# Patient Record
Sex: Male | Born: 1960 | Race: White | Hispanic: No | Smoking: Current every day smoker
Health system: Southern US, Community
[De-identification: ages and names within clinical notes are randomized; demographics above are authoritative.]

## PROBLEM LIST (undated history)

## (undated) DIAGNOSIS — I10 Essential (primary) hypertension: Secondary | ICD-10-CM

## (undated) DIAGNOSIS — M549 Dorsalgia, unspecified: Secondary | ICD-10-CM

## (undated) DIAGNOSIS — E119 Type 2 diabetes mellitus without complications: Secondary | ICD-10-CM

---

## 1998-06-12 ENCOUNTER — Other Ambulatory Visit: Admission: RE | Admit: 1998-06-12 | Discharge: 1998-06-12 | Payer: Self-pay | Admitting: Family Medicine

## 1999-02-27 ENCOUNTER — Emergency Department (HOSPITAL_COMMUNITY): Admission: EM | Admit: 1999-02-27 | Discharge: 1999-02-27 | Payer: Self-pay | Admitting: *Deleted

## 1999-02-27 ENCOUNTER — Encounter: Payer: Self-pay | Admitting: Emergency Medicine

## 1999-03-27 ENCOUNTER — Encounter: Payer: Self-pay | Admitting: Emergency Medicine

## 1999-03-27 ENCOUNTER — Emergency Department (HOSPITAL_COMMUNITY): Admission: EM | Admit: 1999-03-27 | Discharge: 1999-03-27 | Payer: Self-pay | Admitting: Emergency Medicine

## 1999-04-01 ENCOUNTER — Emergency Department (HOSPITAL_COMMUNITY): Admission: EM | Admit: 1999-04-01 | Discharge: 1999-04-01 | Payer: Self-pay

## 1999-04-01 ENCOUNTER — Encounter: Payer: Self-pay | Admitting: Emergency Medicine

## 1999-06-02 ENCOUNTER — Encounter: Payer: Self-pay | Admitting: Emergency Medicine

## 1999-06-02 ENCOUNTER — Inpatient Hospital Stay (HOSPITAL_COMMUNITY): Admission: EM | Admit: 1999-06-02 | Discharge: 1999-06-07 | Payer: Self-pay | Admitting: Emergency Medicine

## 1999-06-04 ENCOUNTER — Encounter: Payer: Self-pay | Admitting: Family Medicine

## 1999-06-05 ENCOUNTER — Encounter: Payer: Self-pay | Admitting: Family Medicine

## 1999-06-14 ENCOUNTER — Encounter: Admission: RE | Admit: 1999-06-14 | Discharge: 1999-06-14 | Payer: Self-pay | Admitting: Sports Medicine

## 2000-02-04 ENCOUNTER — Emergency Department (HOSPITAL_COMMUNITY): Admission: EM | Admit: 2000-02-04 | Discharge: 2000-02-04 | Payer: Self-pay | Admitting: Emergency Medicine

## 2005-01-22 ENCOUNTER — Emergency Department (HOSPITAL_COMMUNITY): Admission: EM | Admit: 2005-01-22 | Discharge: 2005-01-22 | Payer: Self-pay | Admitting: Emergency Medicine

## 2005-07-26 ENCOUNTER — Emergency Department (HOSPITAL_COMMUNITY): Admission: EM | Admit: 2005-07-26 | Discharge: 2005-07-26 | Payer: Self-pay | Admitting: Emergency Medicine

## 2006-05-08 ENCOUNTER — Emergency Department (HOSPITAL_COMMUNITY): Admission: EM | Admit: 2006-05-08 | Discharge: 2006-05-08 | Payer: Self-pay | Admitting: *Deleted

## 2006-12-28 ENCOUNTER — Emergency Department (HOSPITAL_COMMUNITY): Admission: EM | Admit: 2006-12-28 | Discharge: 2006-12-28 | Payer: Self-pay | Admitting: Emergency Medicine

## 2007-04-05 ENCOUNTER — Emergency Department (HOSPITAL_COMMUNITY): Admission: EM | Admit: 2007-04-05 | Discharge: 2007-04-06 | Payer: Self-pay | Admitting: Emergency Medicine

## 2007-05-06 ENCOUNTER — Emergency Department (HOSPITAL_COMMUNITY): Admission: EM | Admit: 2007-05-06 | Discharge: 2007-05-06 | Payer: Self-pay | Admitting: Emergency Medicine

## 2007-05-26 ENCOUNTER — Emergency Department (HOSPITAL_COMMUNITY): Admission: EM | Admit: 2007-05-26 | Discharge: 2007-05-27 | Payer: Self-pay | Admitting: Emergency Medicine

## 2007-06-06 ENCOUNTER — Emergency Department (HOSPITAL_COMMUNITY): Admission: EM | Admit: 2007-06-06 | Discharge: 2007-06-07 | Payer: Self-pay | Admitting: Emergency Medicine

## 2007-06-08 ENCOUNTER — Ambulatory Visit (HOSPITAL_COMMUNITY): Admission: RE | Admit: 2007-06-08 | Discharge: 2007-06-08 | Payer: Self-pay | Admitting: Sports Medicine

## 2007-06-28 ENCOUNTER — Emergency Department (HOSPITAL_COMMUNITY): Admission: EM | Admit: 2007-06-28 | Discharge: 2007-06-28 | Payer: Self-pay | Admitting: Emergency Medicine

## 2007-07-07 ENCOUNTER — Emergency Department (HOSPITAL_COMMUNITY): Admission: EM | Admit: 2007-07-07 | Discharge: 2007-07-07 | Payer: Self-pay | Admitting: Emergency Medicine

## 2007-07-24 ENCOUNTER — Emergency Department (HOSPITAL_COMMUNITY): Admission: EM | Admit: 2007-07-24 | Discharge: 2007-07-25 | Payer: Self-pay | Admitting: Emergency Medicine

## 2007-07-31 ENCOUNTER — Emergency Department (HOSPITAL_COMMUNITY): Admission: EM | Admit: 2007-07-31 | Discharge: 2007-08-01 | Payer: Self-pay | Admitting: Emergency Medicine

## 2007-08-17 ENCOUNTER — Encounter: Admission: RE | Admit: 2007-08-17 | Discharge: 2007-08-17 | Payer: Self-pay | Admitting: Sports Medicine

## 2007-08-25 ENCOUNTER — Emergency Department (HOSPITAL_COMMUNITY): Admission: EM | Admit: 2007-08-25 | Discharge: 2007-08-25 | Payer: Self-pay | Admitting: Emergency Medicine

## 2007-08-27 ENCOUNTER — Emergency Department (HOSPITAL_COMMUNITY): Admission: EM | Admit: 2007-08-27 | Discharge: 2007-08-27 | Payer: Self-pay | Admitting: Emergency Medicine

## 2007-09-15 ENCOUNTER — Emergency Department (HOSPITAL_COMMUNITY): Admission: EM | Admit: 2007-09-15 | Discharge: 2007-09-15 | Payer: Self-pay | Admitting: Emergency Medicine

## 2007-09-26 ENCOUNTER — Emergency Department (HOSPITAL_COMMUNITY): Admission: EM | Admit: 2007-09-26 | Discharge: 2007-09-26 | Payer: Self-pay | Admitting: Emergency Medicine

## 2007-11-03 ENCOUNTER — Emergency Department: Payer: Self-pay | Admitting: Emergency Medicine

## 2007-11-28 ENCOUNTER — Emergency Department (HOSPITAL_COMMUNITY): Admission: EM | Admit: 2007-11-28 | Discharge: 2007-11-28 | Payer: Self-pay | Admitting: Emergency Medicine

## 2007-12-16 ENCOUNTER — Emergency Department (HOSPITAL_COMMUNITY): Admission: EM | Admit: 2007-12-16 | Discharge: 2007-12-16 | Payer: Self-pay | Admitting: Emergency Medicine

## 2007-12-30 ENCOUNTER — Emergency Department: Payer: Self-pay | Admitting: Emergency Medicine

## 2007-12-30 ENCOUNTER — Emergency Department (HOSPITAL_COMMUNITY): Admission: EM | Admit: 2007-12-30 | Discharge: 2007-12-30 | Payer: Self-pay | Admitting: Emergency Medicine

## 2008-07-27 ENCOUNTER — Emergency Department (HOSPITAL_COMMUNITY): Admission: EM | Admit: 2008-07-27 | Discharge: 2008-07-27 | Payer: Self-pay | Admitting: Emergency Medicine

## 2008-09-19 IMAGING — CR DG ORBITS FOR FOREIGN BODY
2 series · 2 of 2 positions shown · non-contrast
Comparison: none

CLINICAL DATA: 45 year-old, pre-MRI lumbar spine.
 ORBITS ? 2 VIEW:

[view not recorded (1 of 2)]
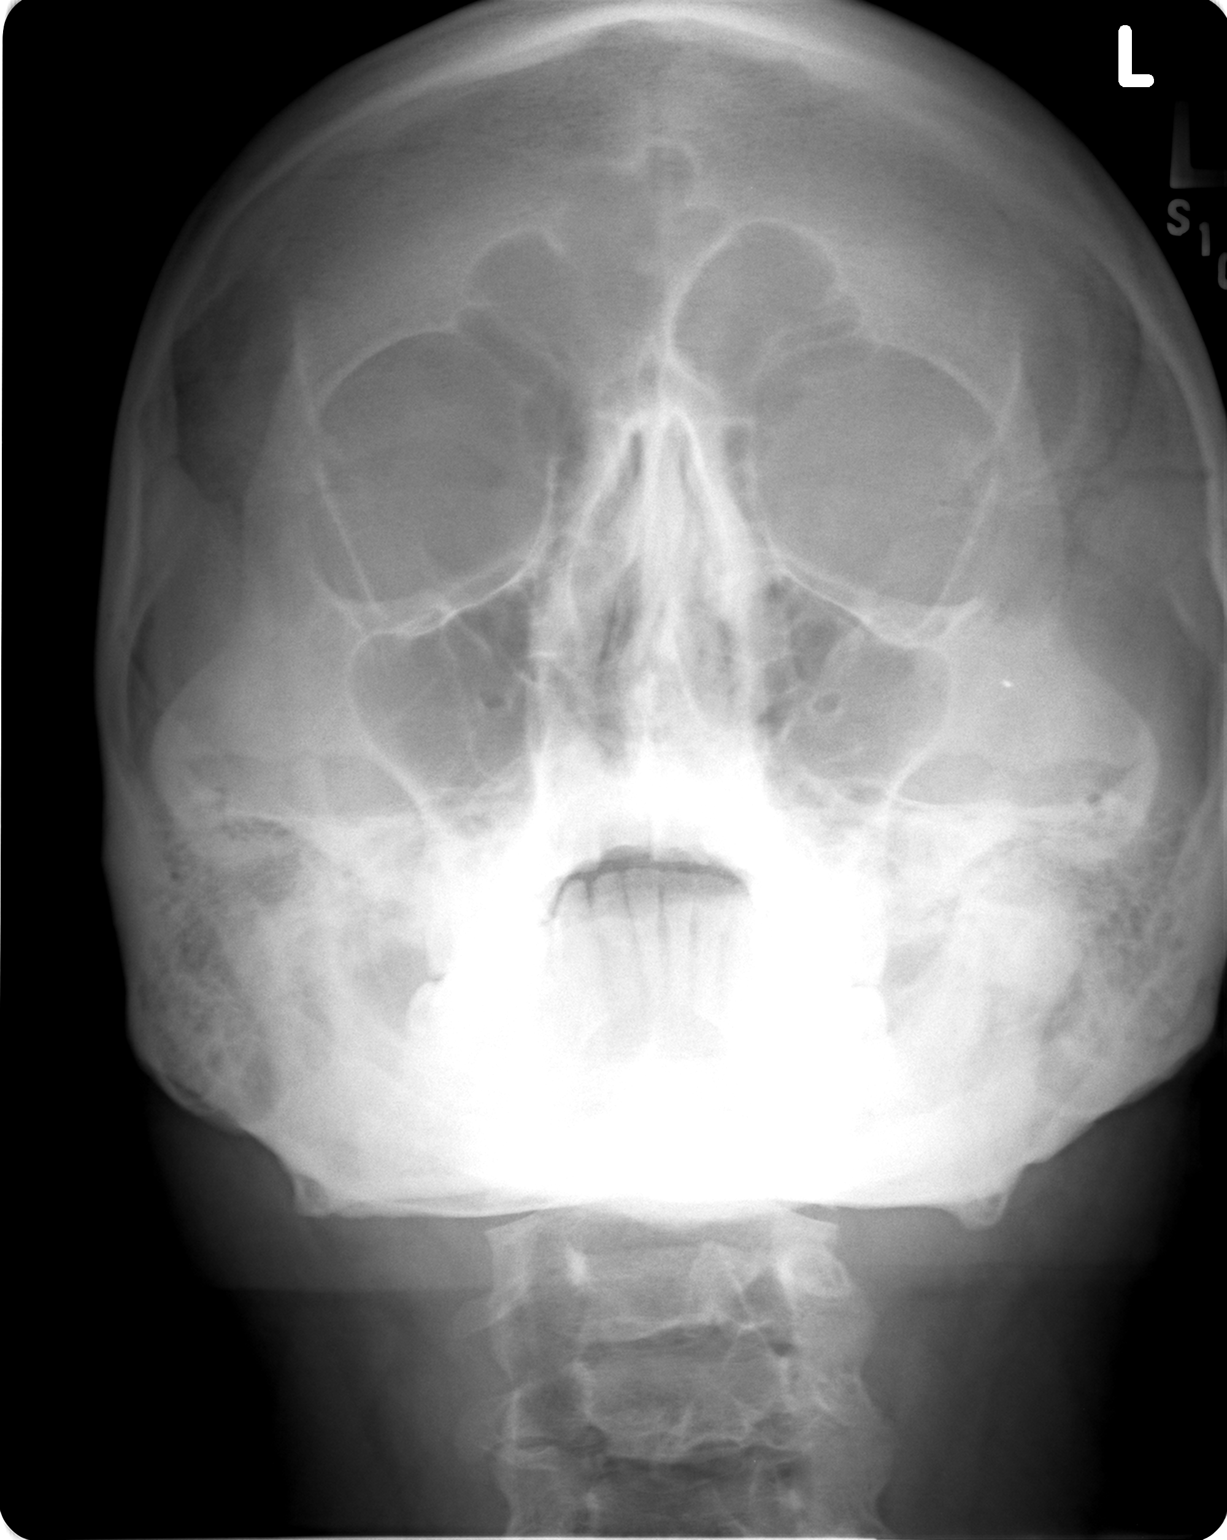

[view not recorded (2 of 2)]
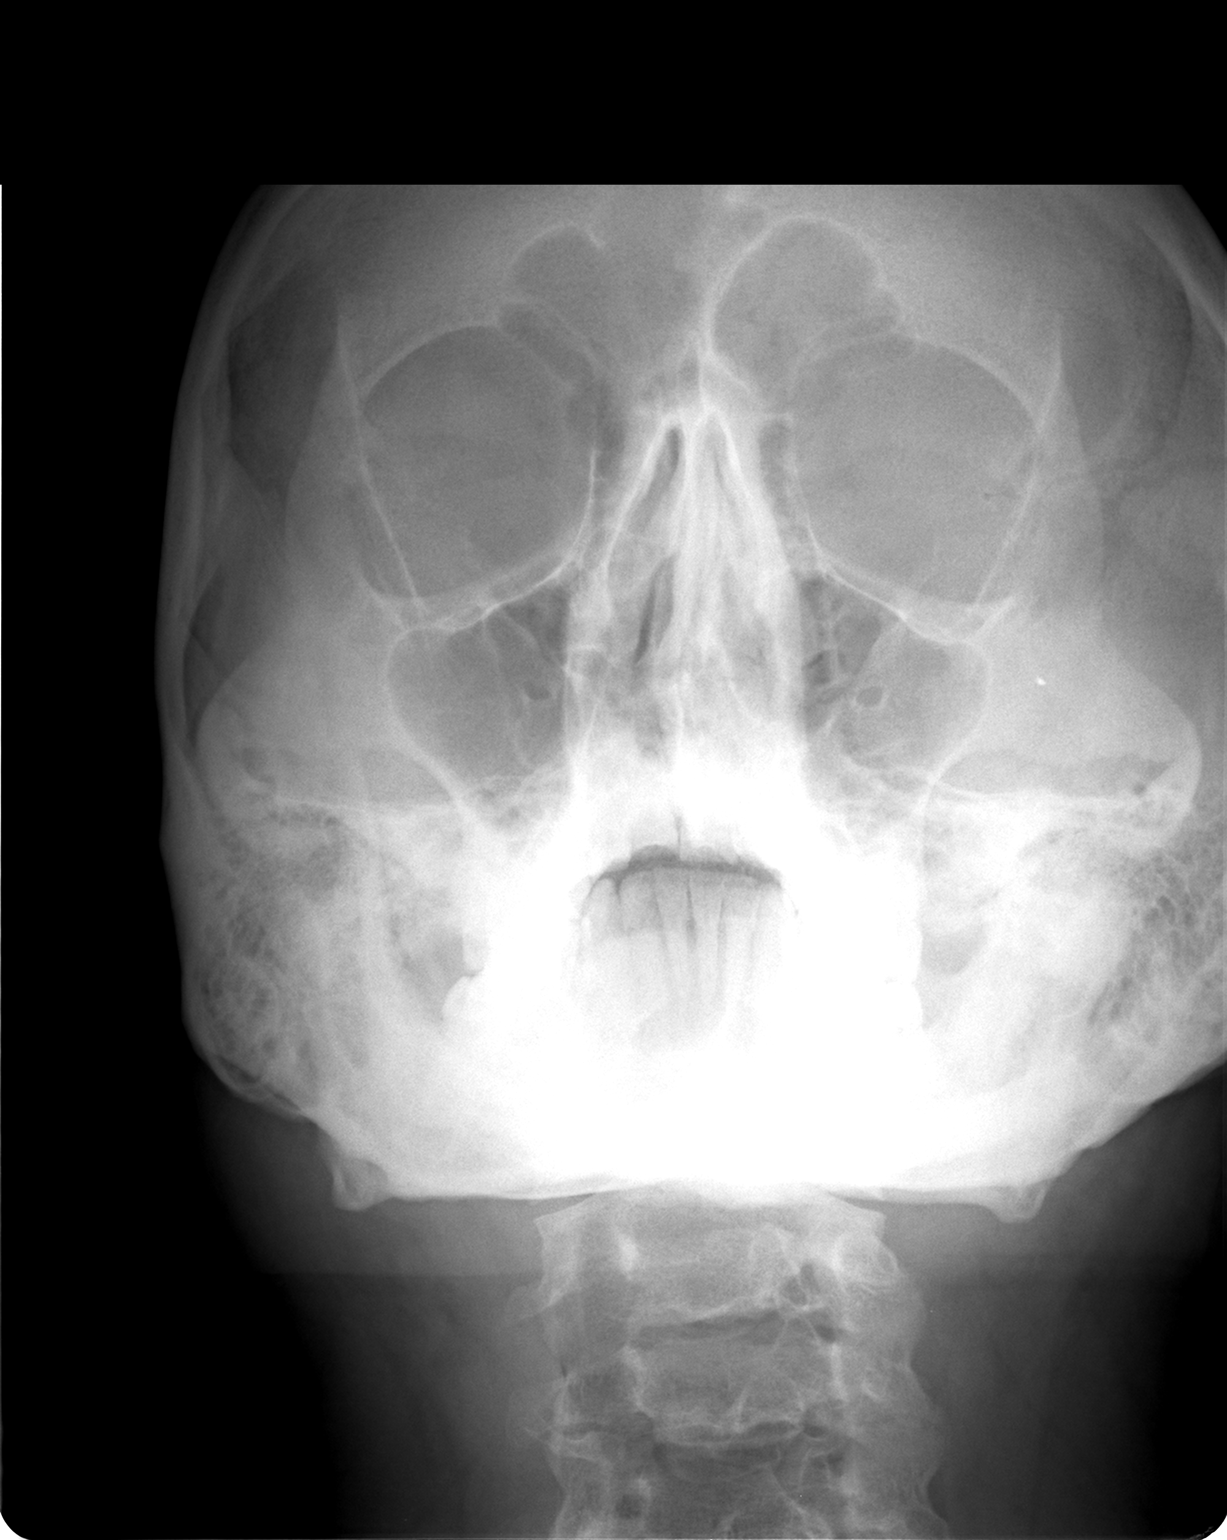

[2 of 2 positions shown; findings below may reference images not displayed]

FINDINGS: There is a tiny metallic foreign body overlying the left maxillary area.  No orbital metallic foreign bodies are seen.
IMPRESSION: No metallic foreign bodies over the orbits.  There is a small foreign body overlying the left maxilla.

## 2008-11-28 IMAGING — CR DG MYELOGRAM LUMBAR
4 series · 4 of 4 positions shown · IV contrast (omnipaque)
Comparison: Lumbar spine films 05/06/07.

CLINICAL DATA: Low back pain.  
 LUMBAR MYELOGRAM:
 Following informed consent, sterile preparation of the back, and adequate local anesthesia, a lumbar puncture was performed using a 22 gauge 5 inch needle at L3-4 from a right paramedian approach.  Fluid was clear and colorless.  15cc of Omnipaque 180 was instilled in the subarachnoid space.  AP, lateral, and oblique views demonstrate
TECHNIQUE: Multidetector CT imaging of the lumbar spine was performed after intrathecal injection of contrast.  Multiplanar CT image reconstructions were also generated.

[view not recorded (1 of 4)]
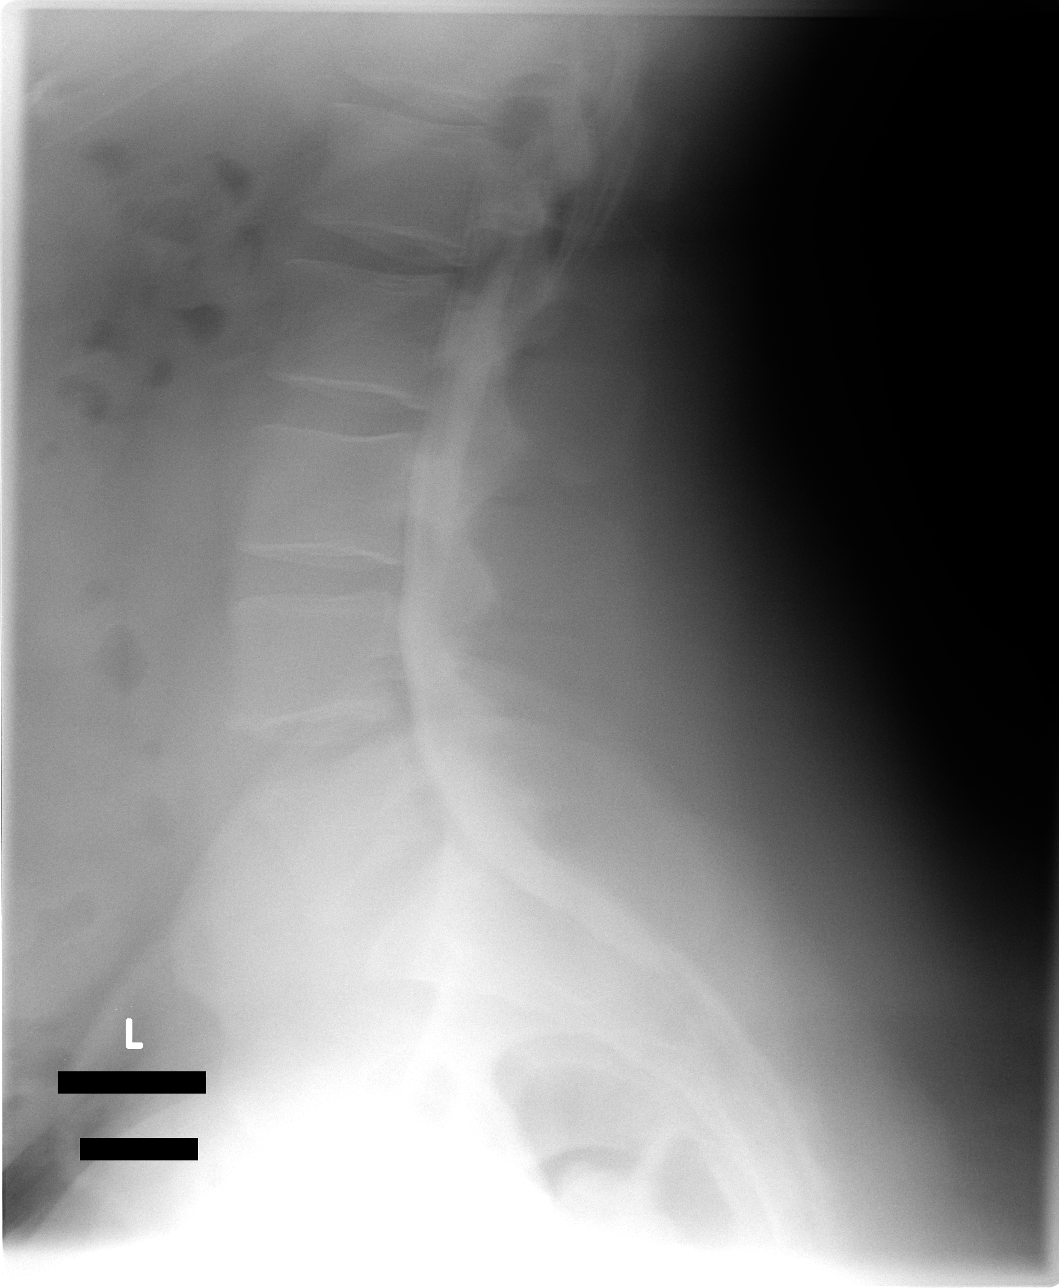

[view not recorded (2 of 4)]
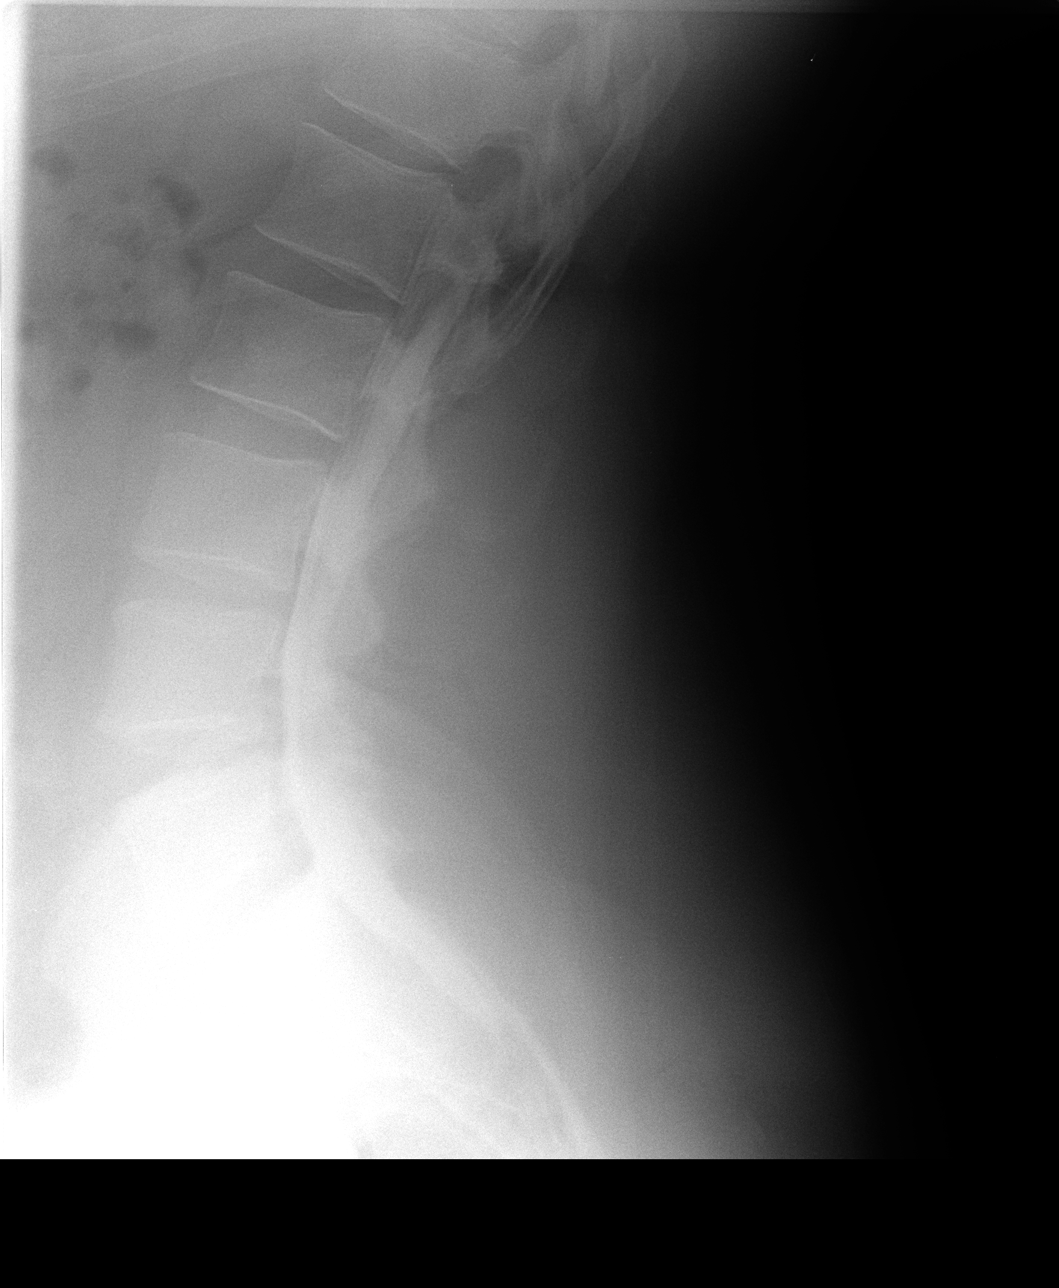

[view not recorded (3 of 4)]
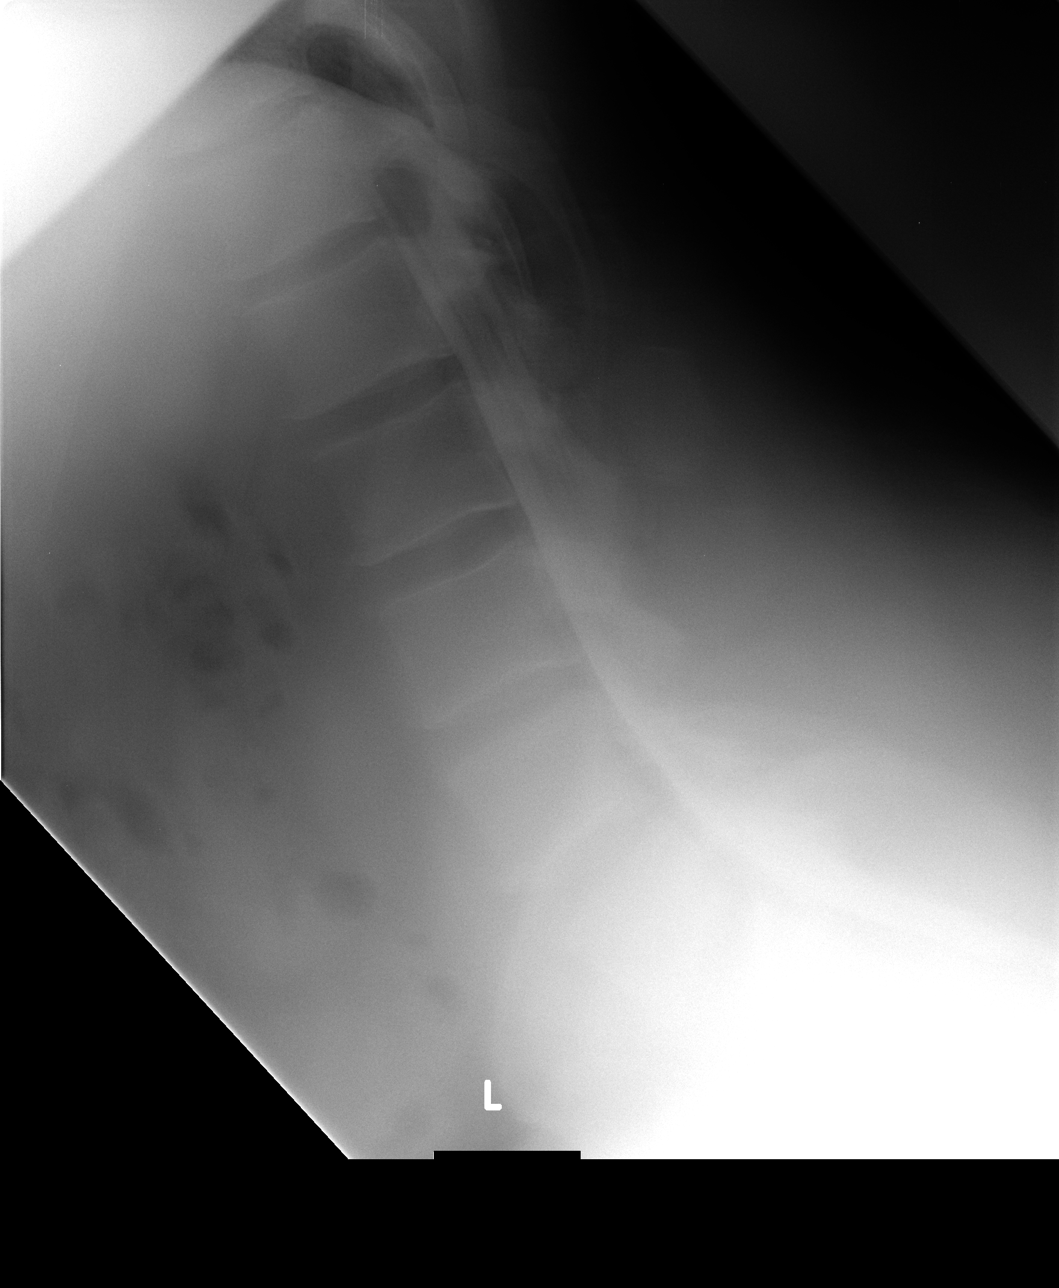

[view not recorded (4 of 4)]
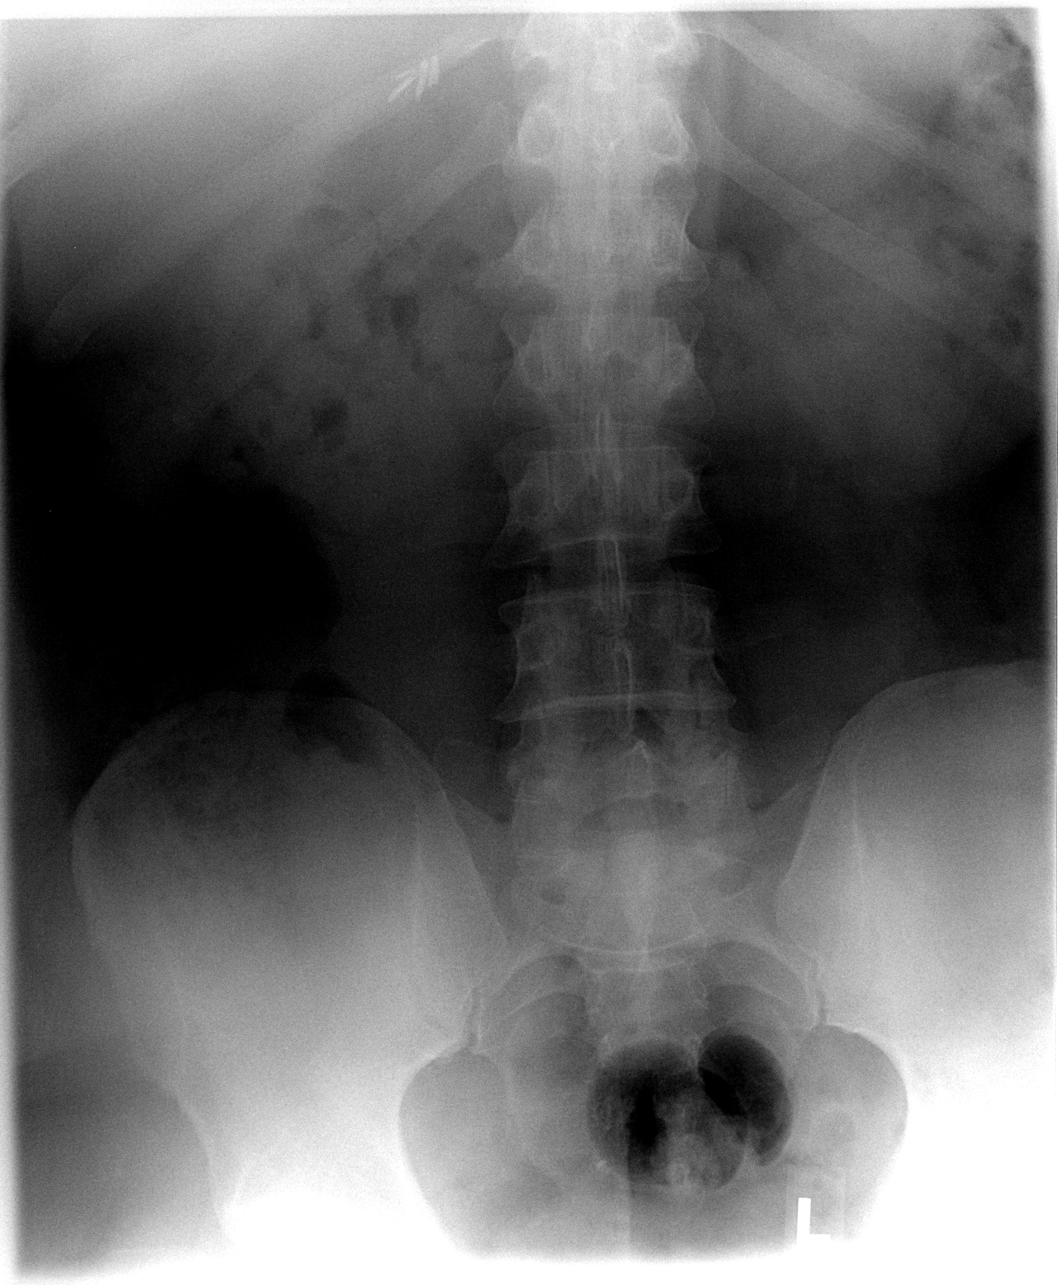

[4 of 4 positions shown; findings below may reference images not displayed]

FINDINGS: AP, lateral, and oblique views demonstrate no significant nerve root cut off or spinal stenosis.  Flexion/extension shows no abnormal movement.
IMPRESSION: Unremarkable lumbar myelography. 
 CT LUMBAR SPINE W/CONTRAST (POST-MYELOGRAM):
FINDINGS: Image quality is slightly reduced due to the patient?s body habitus (360 pounds +). 
 L1-2:  Normal interspace. 
 L2-3:  Normal interspace. 
 L3-4:  Normal interspace. 
 L4-5:  Mild facet arthropathy.  No stenosis or disc protrusion. 
 L5-S1:  Mild facet arthropathy.   No stenosis or disc protrusion.  
 Far right and left parasagittal images do not show significant foraminal narrowing or pars defects.
IMPRESSION: 1.  Lower lumbar facet arthropathy, worst at L4-5. 
 2.  No stenosis or disc protrusion.

## 2009-03-18 ENCOUNTER — Emergency Department (HOSPITAL_COMMUNITY): Admission: EM | Admit: 2009-03-18 | Discharge: 2009-03-18 | Payer: Self-pay | Admitting: Emergency Medicine

## 2009-10-08 ENCOUNTER — Emergency Department (HOSPITAL_COMMUNITY): Admission: EM | Admit: 2009-10-08 | Discharge: 2009-10-08 | Payer: Self-pay | Admitting: Emergency Medicine

## 2009-10-28 ENCOUNTER — Emergency Department (HOSPITAL_COMMUNITY): Admission: EM | Admit: 2009-10-28 | Discharge: 2009-10-29 | Payer: Self-pay | Admitting: Emergency Medicine

## 2010-02-07 ENCOUNTER — Emergency Department (HOSPITAL_COMMUNITY): Admission: EM | Admit: 2010-02-07 | Discharge: 2010-02-07 | Payer: Self-pay | Admitting: Emergency Medicine

## 2010-05-29 ENCOUNTER — Emergency Department (HOSPITAL_COMMUNITY): Admission: EM | Admit: 2010-05-29 | Discharge: 2010-05-29 | Payer: Self-pay | Admitting: Emergency Medicine

## 2010-07-05 ENCOUNTER — Emergency Department (HOSPITAL_COMMUNITY): Admission: EM | Admit: 2010-07-05 | Discharge: 2010-07-05 | Payer: Self-pay | Admitting: Emergency Medicine

## 2011-02-18 LAB — CBC
HCT: 41.4 % (ref 39.0–52.0)
MCV: 98.4 fL (ref 78.0–100.0)
RBC: 4.2 MIL/uL — ABNORMAL LOW (ref 4.22–5.81)
RDW: 14.1 % (ref 11.5–15.5)
WBC: 8 10*3/uL (ref 4.0–10.5)

## 2011-02-18 LAB — DIFFERENTIAL
Basophils Absolute: 0 10*3/uL (ref 0.0–0.1)
Eosinophils Relative: 2 % (ref 0–5)
Lymphocytes Relative: 25 % (ref 12–46)
Lymphs Abs: 2 10*3/uL (ref 0.7–4.0)
Monocytes Absolute: 0.4 10*3/uL (ref 0.1–1.0)
Neutro Abs: 5.3 10*3/uL (ref 1.7–7.7)

## 2011-02-18 LAB — POCT I-STAT, CHEM 8
BUN: 14 mg/dL (ref 6–23)
Calcium, Ion: 1.06 mmol/L — ABNORMAL LOW (ref 1.12–1.32)
Chloride: 106 mEq/L (ref 96–112)
Creatinine, Ser: 1.3 mg/dL (ref 0.4–1.5)
Sodium: 141 mEq/L (ref 135–145)
TCO2: 25 mmol/L (ref 0–100)

## 2011-09-12 LAB — CBC
HCT: 42.5
Hemoglobin: 14.7
MCV: 94.6
WBC: 9.1

## 2011-09-12 LAB — URINALYSIS, ROUTINE W REFLEX MICROSCOPIC
Glucose, UA: NEGATIVE
Hgb urine dipstick: NEGATIVE
Protein, ur: NEGATIVE
pH: 5.5

## 2011-09-12 LAB — COMPREHENSIVE METABOLIC PANEL
Albumin: 3.4 — ABNORMAL LOW
Alkaline Phosphatase: 76
BUN: 15
CO2: 29
Chloride: 104
Creatinine, Ser: 1.21
GFR calc non Af Amer: >60
Glucose, Bld: 112 — ABNORMAL HIGH
Potassium: 4.8
Total Bilirubin: 0.7

## 2011-09-12 LAB — DIFFERENTIAL
Basophils Absolute: 0.1
Basophils Relative: 1
Lymphocytes Relative: 22
Monocytes Absolute: 0.7
Neutro Abs: 6.1
Neutrophils Relative %: 67

## 2011-09-12 LAB — LIPASE, BLOOD: Lipase: 21

## 2013-08-14 ENCOUNTER — Emergency Department (HOSPITAL_COMMUNITY)
Admission: EM | Admit: 2013-08-14 | Discharge: 2013-08-14 | Disposition: A | Discharge status: Home / Self Care | Payer: Medicare HMO | Attending: Emergency Medicine | Admitting: Emergency Medicine

## 2013-08-14 ENCOUNTER — Encounter (HOSPITAL_COMMUNITY): Payer: Self-pay | Admitting: *Deleted

## 2013-08-14 DIAGNOSIS — K089 Disorder of teeth and supporting structures, unspecified: Secondary | ICD-10-CM | POA: Insufficient documentation

## 2013-08-14 DIAGNOSIS — K0889 Other specified disorders of teeth and supporting structures: Secondary | ICD-10-CM

## 2013-08-14 DIAGNOSIS — Z79899 Other long term (current) drug therapy: Secondary | ICD-10-CM | POA: Insufficient documentation

## 2013-08-14 DIAGNOSIS — E119 Type 2 diabetes mellitus without complications: Secondary | ICD-10-CM | POA: Insufficient documentation

## 2013-08-14 DIAGNOSIS — I10 Essential (primary) hypertension: Secondary | ICD-10-CM | POA: Insufficient documentation

## 2013-08-14 DIAGNOSIS — F17200 Nicotine dependence, unspecified, uncomplicated: Secondary | ICD-10-CM | POA: Insufficient documentation

## 2013-08-14 DIAGNOSIS — Z7982 Long term (current) use of aspirin: Secondary | ICD-10-CM | POA: Insufficient documentation

## 2013-08-14 HISTORY — DX: Type 2 diabetes mellitus without complications: E11.9

## 2013-08-14 HISTORY — DX: Morbid (severe) obesity due to excess calories: E66.01

## 2013-08-14 HISTORY — DX: Dorsalgia, unspecified: M54.9

## 2013-08-14 HISTORY — DX: Essential (primary) hypertension: I10

## 2013-08-14 MED ORDER — PENICILLIN V POTASSIUM 250 MG PO TABS
500.0000 mg | ORAL_TABLET | Freq: Once | ORAL | Status: AC
  Administered 2013-08-14: 500 mg via ORAL
  Filled 2013-08-14: qty 2

## 2013-08-14 MED ORDER — PENICILLIN V POTASSIUM 500 MG PO TABS: 500.0000 mg | ORAL_TABLET | Freq: Four times a day (QID) | ORAL | Status: AC

## 2013-08-14 MED ORDER — OXYCODONE-ACETAMINOPHEN 5-325 MG PO TABS: 1.0000 | ORAL_TABLET | Freq: Three times a day (TID) | ORAL | Status: DC | PRN

## 2013-08-14 NOTE — ED Notes (Signed)
Pt reports having a missing filling to right lower tooth and now having pain and swelling. Airway intact.

## 2013-08-14 NOTE — ED Provider Notes (Signed)
CSN: [id number]     Arrival date & time 08/14/13  1127 History   First MD Initiated Contact with Patient 08/14/13 1134     Chief Complaint  Patient presents with  . Dental Pain   (Consider location/radiation/quality/duration/timing/severity/associated sxs/prior Treatment) The history is provided by the patient. No language interpreter was used.  Lee Gray is a 52 year old male with past medical history of diabetes, hypertension presenting to emergency department with dental pain that started yesterday. Patient reported that the discomfort is localized to the right lower jaw, reported that it is deep within his tooth an intermittent aching, pulsating sensation. Patient reported that he has noticed mild swelling to the right side of his face. Reported that the pain is worse with heat and cold with nothing making the pain better. Reported that he has used Tylenol and warm compressions with negative relief. Patient reported that he is missing filling to the right tooth, reported that he does not know when this occurred. Reported that the pain has gotten worse. Patient reported that he already is in contact with [facility] and in process of setting up an appointment with dentist. Denied fever, chills, neck pain, neck stiffness, chest pain, shortness of breath, difficulty breathing, difficulty swallowing, jaw pain, ear pain. PCP none  Past Medical History  Diagnosis Date  . Diabetes mellitus without complication   . Hypertension   . Back pain   . Obesity, morbid    History reviewed. No pertinent past surgical history. History reviewed. No pertinent family history. History  Substance Use Topics  . Smoking status: Current Every Day Smoker    Types: Cigarettes  . Smokeless tobacco: Not on file  . Alcohol Use: Not on file    Review of Systems  Constitutional: Negative for fever and chills.  HENT: Positive for facial swelling and dental problem. Negative for ear pain, sore throat, trouble  swallowing, neck pain and neck stiffness.   Respiratory: Negative for shortness of breath.   Cardiovascular: Negative for chest pain.  Neurological: Negative for weakness and headaches.  All other systems reviewed and are negative.    Allergies  Review of patient's allergies indicates no known allergies.  Home Medications   Current Outpatient Rx  Name  Route  Sig  Dispense  Refill  . acetaminophen (TYLENOL) 325 MG tablet   Oral   Take 650 mg by mouth every 6 (six) hours as needed for pain.         Marland Kitchen albuterol (PROVENTIL HFA;VENTOLIN HFA) 108 (90 BASE) MCG/ACT inhaler   Inhalation   Inhale 2 puffs into the lungs every 6 (six) hours as needed for wheezing or shortness of breath.         Marland Kitchen albuterol (PROVENTIL) (2.5 MG/3ML) 0.083% nebulizer solution   Nebulization   Take 2.5 mg by nebulization every 6 (six) hours as needed for wheezing or shortness of breath.         Marland Kitchen aspirin 81 MG tablet   Oral   Take 81 mg by mouth daily.         Marland Kitchen esomeprazole (NEXIUM) 40 MG capsule   Oral   Take 40 mg by mouth daily.         . furosemide (LASIX) 20 MG tablet   Oral   Take by mouth 2 (two) times daily.         Marland Kitchen levothyroxine (SYNTHROID, LEVOTHROID) 200 MCG tablet   Oral   Take 200 mcg by mouth daily before breakfast.         .  levothyroxine (SYNTHROID, LEVOTHROID) 75 MCG tablet   Oral   Take 75 mcg by mouth daily before breakfast.         . metFORMIN (GLUCOPHAGE) 500 MG tablet   Oral   Take 500 mg by mouth daily.         . metoprolol (LOPRESSOR) 50 MG tablet   Oral   Take 50 mg by mouth daily.         Marland Kitchen oxyCODONE-acetaminophen (PERCOCET/ROXICET) 5-325 MG per tablet   Oral   Take 1 tablet by mouth every 8 (eight) hours as needed for pain.   5 tablet   0   . penicillin v potassium (VEETID) 500 MG tablet   Oral   Take 1 tablet (500 mg total) by mouth 4 (four) times daily.   40 tablet   0    BP 131/86  Pulse 68  Temp(Src) 98.6 F (37 C) (Oral)   Resp 18  SpO2 94% Physical Exam  Nursing note and vitals reviewed. Constitutional: He is oriented to person, place, and time. He appears well-developed and well-nourished. No distress.  HENT:  Head: Normocephalic and atraumatic.  Mouth/Throat: Oropharynx is clear and moist.    Mild facial swelling noted to the right side Negative pain upon palpation to the frontal and maxillary sinus regions Discomfort upon palpation to the right mandibular region, all along jawline  Negative signs of peritonsillar abscess. Uvula midline, symmetrical elevation. Negative signs of Ludwig's angina. Negative trismus. Negative abscess or cyst formation. Negative sublingual lesions. Negative active drainage of pus or blood. Negative erythema, inflammation, lesions, sores, abscesses, cysts, swelling noted to the buccal mucosa and gumlines bilaterally. First molar of right mandibular region with pain upon palpation with tongue depressor, missing filling identified. Negative signs of tooth decay. Numerous dental caries identified in the mouth.   Eyes: Conjunctivae and EOM are normal. Pupils are equal, round, and reactive to light. Right eye exhibits no discharge. Left eye exhibits no discharge.  Neck: Normal range of motion. Neck supple.  Negative neck stiffness Negative nuchal rigidity Negative pain upon palpation to the neck Negative cervical lymphadenopathy identified   Cardiovascular: Normal rate, regular rhythm and normal heart sounds.  Exam reveals no friction rub.   No murmur heard. Pulmonary/Chest: Effort normal. He has wheezes.  Bilateral upper and lower expiratory wheezes - patient reports that this is common for him, reported that he has a nebulizer treatment at home and takes nebulized treatment at least 3 times per day.  Lymphadenopathy:    He has no cervical adenopathy.  Neurological: He is alert and oriented to person, place, and time. He exhibits normal muscle tone. Coordination normal.  Cranial  nerves III through XII grossly intact  Skin: Skin is warm and dry. No rash noted. He is not diaphoretic. No erythema.  Psychiatric: He has a normal mood and affect. His behavior is normal. Thought content normal.    ED Course  Procedures (including critical care time)  Offered nebulizer treatment for patient while in ED setting, patient declined - reported that he has a nebulizer set up at home.  Labs Review Labs Reviewed - No data to display Imaging Review No results found.  MDM   1. Pain, dental   2. DM (diabetes mellitus)   3. HTN (hypertension)     Patient presenting to emergency department with dental pain that started yesterday. Alert and oriented. Mild swelling noted to the right side of the face with negative erythema, inflammation, warmth upon palpation. Negative  signs of peritonsillar abscess. Negative signs of Ludwig's angina. Negative trismus. Negative sublingual lesions. Cranial nerves II through XII grossly intact. First molar of right mandibular region with pain upon palpation with tongue depressor, missing filling identified. Negative signs of decay. Teeth for the most part in good condition. Numerous cavities and fillings identified in the mouth. Negative respiratory distress. Airway intact. Patient stable, afebrile. Patient given antibiotics first dose in ED setting. Discharged patient with antibiotics and small dose of pain medications. Discussed with patient course, precautions, disposal of pain medications. Referred patient to dentist. Free dental services given and discharge paperwork. Discussed with patient to rest and stay hydrated. Discussed with patient to continue to monitor symptoms and if symptoms are to worsen or change to report back to emergency department - strict return instructions given. Patient agreed to plan of care, understood, all questions answered.    Raymon Mutton, PA-C 08/15/13 1515

## 2013-08-15 NOTE — ED Provider Notes (Signed)
Medical screening examination/treatment/procedure(s) were performed by non-physician practitioner and as supervising physician I was immediately available for consultation/collaboration.  Raeford Razor, MD 08/15/13 1556

## 2013-09-28 ENCOUNTER — Emergency Department (HOSPITAL_COMMUNITY): Payer: Medicare HMO

## 2013-09-28 ENCOUNTER — Encounter (HOSPITAL_COMMUNITY): Payer: Self-pay | Admitting: Emergency Medicine

## 2013-09-28 ENCOUNTER — Emergency Department (HOSPITAL_COMMUNITY)
Admission: EM | Admit: 2013-09-28 | Discharge: 2013-09-28 | Disposition: A | Discharge status: Home / Self Care | Payer: Medicare HMO | Attending: Emergency Medicine | Admitting: Emergency Medicine

## 2013-09-28 DIAGNOSIS — Y939 Activity, unspecified: Secondary | ICD-10-CM | POA: Insufficient documentation

## 2013-09-28 DIAGNOSIS — S59909A Unspecified injury of unspecified elbow, initial encounter: Secondary | ICD-10-CM | POA: Insufficient documentation

## 2013-09-28 DIAGNOSIS — I10 Essential (primary) hypertension: Secondary | ICD-10-CM | POA: Insufficient documentation

## 2013-09-28 DIAGNOSIS — S46909A Unspecified injury of unspecified muscle, fascia and tendon at shoulder and upper arm level, unspecified arm, initial encounter: Secondary | ICD-10-CM | POA: Insufficient documentation

## 2013-09-28 DIAGNOSIS — W19XXXA Unspecified fall, initial encounter: Secondary | ICD-10-CM

## 2013-09-28 DIAGNOSIS — W08XXXA Fall from other furniture, initial encounter: Secondary | ICD-10-CM | POA: Insufficient documentation

## 2013-09-28 DIAGNOSIS — Y92009 Unspecified place in unspecified non-institutional (private) residence as the place of occurrence of the external cause: Secondary | ICD-10-CM | POA: Insufficient documentation

## 2013-09-28 DIAGNOSIS — M7581 Other shoulder lesions, right shoulder: Secondary | ICD-10-CM

## 2013-09-28 DIAGNOSIS — Z7982 Long term (current) use of aspirin: Secondary | ICD-10-CM | POA: Insufficient documentation

## 2013-09-28 DIAGNOSIS — E119 Type 2 diabetes mellitus without complications: Secondary | ICD-10-CM | POA: Insufficient documentation

## 2013-09-28 DIAGNOSIS — M25521 Pain in right elbow: Secondary | ICD-10-CM

## 2013-09-28 DIAGNOSIS — R062 Wheezing: Secondary | ICD-10-CM | POA: Insufficient documentation

## 2013-09-28 DIAGNOSIS — M719 Bursopathy, unspecified: Secondary | ICD-10-CM | POA: Insufficient documentation

## 2013-09-28 DIAGNOSIS — M67919 Unspecified disorder of synovium and tendon, unspecified shoulder: Secondary | ICD-10-CM | POA: Insufficient documentation

## 2013-09-28 DIAGNOSIS — Z79899 Other long term (current) drug therapy: Secondary | ICD-10-CM | POA: Insufficient documentation

## 2013-09-28 DIAGNOSIS — M25449 Effusion, unspecified hand: Secondary | ICD-10-CM | POA: Insufficient documentation

## 2013-09-28 DIAGNOSIS — F17200 Nicotine dependence, unspecified, uncomplicated: Secondary | ICD-10-CM | POA: Insufficient documentation

## 2013-09-28 DIAGNOSIS — M545 Low back pain: Secondary | ICD-10-CM

## 2013-09-28 DIAGNOSIS — IMO0002 Reserved for concepts with insufficient information to code with codable children: Secondary | ICD-10-CM | POA: Insufficient documentation

## 2013-09-28 DIAGNOSIS — S4980XA Other specified injuries of shoulder and upper arm, unspecified arm, initial encounter: Secondary | ICD-10-CM | POA: Insufficient documentation

## 2013-09-28 DIAGNOSIS — S6990XA Unspecified injury of unspecified wrist, hand and finger(s), initial encounter: Secondary | ICD-10-CM | POA: Insufficient documentation

## 2013-09-28 MED ORDER — HYDROCODONE-ACETAMINOPHEN 5-325 MG PO TABS: 1.0000 | ORAL_TABLET | ORAL | Status: DC | PRN

## 2013-09-28 MED ORDER — METHOCARBAMOL 500 MG PO TABS: 500.0000 mg | ORAL_TABLET | Freq: Four times a day (QID) | ORAL | Status: AC

## 2013-09-28 NOTE — ED Provider Notes (Signed)
CSN: [id number]     Arrival date & time 09/28/13  1443 History  This chart was scribed for non-physician practitioner Dierdre Forth, PA-C, working with Enid Skeens, MD by Dorothey Baseman, ED Scribe. This patient was seen in room TR05C/TR05C and the patient's care was started at 4:42 PM.    Chief Complaint  Patient presents with  . Fall   Patient is a 52 y.o. male presenting with fall. The history is provided by the patient. No language interpreter was used.  Fall This is a new problem. The current episode started yesterday. The problem occurs constantly. The problem has not changed since onset.He has tried nothing for the symptoms.   HPI Comments: Lee Gray is a 52 y.o. male who presents to the Emergency Department complaining of a fall from a back porch, around 2-3 feet high, that occurred yesterday. He states that he was able to "catch himself," but twisted his back and "jammed" the elbow. Patient reports a constant pain to the lower back, right shoulder, right arm, and right elbow secondary to the fall. He states that the elbow pain is exacerbated with flexion and extension. He reports some associated subjective swelling to the fingers of the right hand. Patient reports that he has an appointment with his PCP next week, but was advised to come to the ED fist. He denies taking any medications at home to treat his symptoms. He denies any wrist or hand pain, numbness, or paresthesias. Patient reports a history of back pain, DM without complication, and HTN.   Past Medical History  Diagnosis Date  . Diabetes mellitus without complication   . Hypertension   . Back pain   . Obesity, morbid    History reviewed. No pertinent past surgical history. History reviewed. No pertinent family history. History  Substance Use Topics  . Smoking status: Current Every Day Smoker    Types: Cigarettes  . Smokeless tobacco: Not on file  . Alcohol Use: Not on file    Review of Systems   Constitutional: Negative for fever and chills.  Gastrointestinal: Negative for nausea and vomiting.  Musculoskeletal: Positive for arthralgias, back pain, joint swelling and myalgias. Negative for neck pain and neck stiffness.  Skin: Negative for wound.  Neurological: Negative for numbness.  Hematological: Does not bruise/bleed easily.  Psychiatric/Behavioral: The patient is not nervous/anxious.   All other systems reviewed and are negative.    Allergies  Review of patient's allergies indicates no known allergies.  Home Medications   Current Outpatient Rx  Name  Route  Sig  Dispense  Refill  . acetaminophen (TYLENOL) 325 MG tablet   Oral   Take 650 mg by mouth every 6 (six) hours as needed for pain.         Marland Kitchen albuterol (PROVENTIL HFA;VENTOLIN HFA) 108 (90 BASE) MCG/ACT inhaler   Inhalation   Inhale 2 puffs into the lungs every 6 (six) hours as needed for wheezing or shortness of breath.         Marland Kitchen albuterol (PROVENTIL) (2.5 MG/3ML) 0.083% nebulizer solution   Nebulization   Take 2.5 mg by nebulization every 6 (six) hours as needed for wheezing or shortness of breath.         Marland Kitchen aspirin 81 MG tablet   Oral   Take 81 mg by mouth daily.         Marland Kitchen esomeprazole (NEXIUM) 40 MG capsule   Oral   Take 40 mg by mouth daily.         Marland Kitchen  furosemide (LASIX) 20 MG tablet   Oral   Take 20 mg by mouth 2 (two) times daily.          Marland Kitchen glimepiride (AMARYL) 4 MG tablet   Oral   Take 4 mg by mouth daily before breakfast.         . ipratropium-albuterol (DUONEB) 0.5-2.5 (3) MG/3ML SOLN   Nebulization   Take 3 mLs by nebulization every 6 (six) hours as needed (for shortness of breath).         Marland Kitchen levothyroxine (SYNTHROID, LEVOTHROID) 200 MCG tablet   Oral   Take 200 mcg by mouth daily before breakfast. *takes with for dose         . levothyroxine (SYNTHROID, LEVOTHROID) 75 MCG tablet   Oral   Take 75 mcg by mouth daily before breakfast. Takes with  for dose         . metFORMIN (GLUCOPHAGE) 500 MG tablet   Oral   Take 500 mg by mouth 2 (two) times daily with a meal.          . metoprolol (LOPRESSOR) 50 MG tablet   Oral   Take 50 mg by mouth 2 (two) times daily.          Marland Kitchen HYDROcodone-acetaminophen (NORCO/VICODIN) 5-325 MG per tablet   Oral   Take 1 tablet by mouth every 4 (four) hours as needed for pain.   6 tablet   0   . methocarbamol (ROBAXIN) 500 MG tablet   Oral   Take 1 tablet (500 mg total) by mouth 4 (four) times daily.   20 tablet   0    Triage Vitals: BP 145/79  Pulse 81  Temp(Src) 97.9 F (36.6 C) (Oral)  Resp 24  Ht 6' (1.829 m)  Wt 406 lb 3.2 oz (184.251 kg)  BMI 55.08 kg/m2  SpO2 95%  Physical Exam  Nursing note and vitals reviewed. Constitutional: He is oriented to person, place, and time. He appears well-developed and well-nourished. No distress.  Awake, alert, nontoxic appearance  HENT:  Head: Normocephalic and atraumatic.  Mouth/Throat: Oropharynx is clear and moist. No oropharyngeal exudate.  Eyes: Conjunctivae are normal. No scleral icterus.  Neck: Normal range of motion. Neck supple.  Full ROM without pain  Cardiovascular: Normal rate, regular rhythm, S1 normal, S2 normal, normal heart sounds and intact distal pulses.   Pulses:      Radial pulses are 2+ on the right side, and 2+ on the left side.       Dorsalis pedis pulses are 2+ on the right side, and 2+ on the left side.       Posterior tibial pulses are 2+ on the right side, and 2+ on the left side.  Capillary refill less than 3 seconds  Pulmonary/Chest: Effort normal. No respiratory distress. He has decreased breath sounds. He has wheezes.  Mild wheezing throughout, patient reports this is his baseline and he is breathing fine - oxygen in place and pt wears oxygen at home  Abdominal: Soft. Bowel sounds are normal. He exhibits no distension. There is no tenderness. There is no rebound.  No ecchymosis  Musculoskeletal: He  exhibits no edema.       Right shoulder: He exhibits decreased range of motion, tenderness and pain. He exhibits no bony tenderness, no swelling, no effusion, no crepitus, no deformity, no laceration, no spasm, normal pulse and normal strength.       Right elbow: He exhibits decreased range  of motion (2/2 pain). He exhibits no swelling, no effusion, no deformity and no laceration. Tenderness found. Radial head and olecranon process tenderness noted. No medial epicondyle and no lateral epicondyle tenderness noted.       Cervical back: Normal.       Thoracic back: Normal.       Lumbar back: He exhibits tenderness and spasm.  Full range of motion of the T-spine and L-spine No tenderness to palpation of the spinous processes of the T-spine or L-spine Mild tenderness to palpation of the right paraspinous muscles of the L-spine  Decreased range of motion of the right shoulder secondary to pain. Positive empty can test. No ecchymosis or visible swelling of the right elbow. Pain to palpation the radial head and on the olecranon process.    Lymphadenopathy:    He has no cervical adenopathy.  Neurological: He is alert and oriented to person, place, and time. He has normal reflexes. No cranial nerve deficit.  Speech is clear and goal oriented, follows commands Normal strength in upper and lower extremities bilaterally including dorsiflexion and plantar flexion, strong and equal grip strength Sensation normal to light and sharp touch Moves extremities without ataxia, coordination intact Normal gait Normal balance   Skin: Skin is warm and dry. No rash noted. He is not diaphoretic. No erythema.  Psychiatric: He has a normal mood and affect.    ED Course  Procedures (including critical care time)  DIAGNOSTIC STUDIES: Oxygen Saturation is 95% on room air, normal by my interpretation.    COORDINATION OF CARE: 4:46 PM- Will order an x-ray of the right elbow. Discussed treatment plan with patient at  bedside and patient verbalized agreement.     Labs Review Labs Reviewed - No data to display  Imaging Review Dg Elbow Complete Right  09/28/2013   CLINICAL DATA:  Fall, posterior pain  EXAM: RIGHT ELBOW - COMPLETE 3+ VIEW  COMPARISON:  None.  FINDINGS: There is no evidence of fracture, dislocation, or joint effusion. There is no evidence of arthropathy or other focal bone abnormality. Soft tissues are unremarkable.  IMPRESSION: No acute osseous finding   Electronically Signed   By: Ruel Favors M.D.   On: 09/28/2013 18:25    EKG Interpretation   None       MDM   1. Fall at home, initial encounter   2. Right elbow pain   3. Low back pain   4. Rotator cuff tendonitis, right      Lee Gray presents after fall at home with right shoulder and right elbow pain.  Patient without ecchymosis, deformity to the right elbow but exam limited 2/2 body habitus.  Will x-ray.   Patient with right rotator cuff tendinitis clinical exam. 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  No neurological deficits and normal neuro exam.  Patient can walk but states is painful.  No loss of bowel or bladder control.  No concern for cauda equina.  RICE protocol and pain medicine indicated and discussed with patient.    6:43 PM Patient X-Ray negative for obvious fracture or dislocation. I personally reviewed the imaging tests through PACS system.  I reviewed available ER/hospitalization records through the EMR.  Pt advised to follow up with orthopedics if symptoms persist for possibility of missed fracture diagnosis. Patient given sling while in ED, conservative therapy recommended and discussed. Patient will be dc home & is  agreeable with above plan.  It has been determined that no acute conditions requiring further emergency intervention are present at this time. The patient/guardian have been advised of the diagnosis and plan. We  have discussed signs and symptoms that warrant return to the ED, such as changes or worsening in symptoms.   Vital signs are stable at discharge.   BP 135/86  Pulse 78  Temp(Src) 97.4 F (36.3 C) (Oral)  Resp 18  Ht 6' (1.829 m)  Wt 406 lb 3.2 oz (184.251 kg)  BMI 55.08 kg/m2  SpO2 99%  Patient/guardian has voiced understanding and agreed to follow-up with the PCP or specialist.    I personally performed the services described in this documentation, which was scribed in my presence. The recorded information has been reviewed and is accurate.     Lee Client Mileidy Atkin, PA-C 09/28/13 1845

## 2013-09-28 NOTE — ED Notes (Signed)
Pt reports having a fall yesterday off back porch, now having lower back pain and right arm/elbow pain.

## 2013-09-28 NOTE — ED Notes (Signed)
Pt is on home O2 he did not bring it with him pt placed on portable O2 in triage while he is waiting for a room in the back

## 2013-09-29 NOTE — ED Provider Notes (Signed)
Medical screening examination/treatment/procedure(s) were performed by non-physician practitioner and as supervising physician I was immediately available for consultation/collaboration.  EKG Interpretation   None         Phoenyx Melka M Tramayne Sebesta, MD 09/29/13 0123 

## 2014-11-27 ENCOUNTER — Encounter (HOSPITAL_COMMUNITY): Payer: Self-pay | Admitting: Nurse Practitioner

## 2014-11-27 ENCOUNTER — Emergency Department (HOSPITAL_COMMUNITY)
Admission: EM | Admit: 2014-11-27 | Discharge: 2014-11-27 | Disposition: A | Discharge status: Home / Self Care | Payer: Medicare Other | Attending: Emergency Medicine | Admitting: Emergency Medicine

## 2014-11-27 DIAGNOSIS — G8929 Other chronic pain: Secondary | ICD-10-CM

## 2014-11-27 DIAGNOSIS — I10 Essential (primary) hypertension: Secondary | ICD-10-CM | POA: Diagnosis not present

## 2014-11-27 DIAGNOSIS — M545 Low back pain: Secondary | ICD-10-CM | POA: Insufficient documentation

## 2014-11-27 DIAGNOSIS — E119 Type 2 diabetes mellitus without complications: Secondary | ICD-10-CM | POA: Diagnosis not present

## 2014-11-27 DIAGNOSIS — Z79899 Other long term (current) drug therapy: Secondary | ICD-10-CM | POA: Diagnosis not present

## 2014-11-27 DIAGNOSIS — Z720 Tobacco use: Secondary | ICD-10-CM | POA: Diagnosis not present

## 2014-11-27 DIAGNOSIS — M549 Dorsalgia, unspecified: Secondary | ICD-10-CM

## 2014-11-27 DIAGNOSIS — Z7982 Long term (current) use of aspirin: Secondary | ICD-10-CM | POA: Insufficient documentation

## 2014-11-27 MED ORDER — OXYCODONE-ACETAMINOPHEN 5-325 MG PO TABS: 1.0000 | ORAL_TABLET | Freq: Three times a day (TID) | ORAL | Status: AC | PRN

## 2014-11-27 MED ORDER — OXYCODONE-ACETAMINOPHEN 5-325 MG PO TABS
2.0000 | ORAL_TABLET | Freq: Once | ORAL | Status: AC
  Administered 2014-11-27: 2 via ORAL
  Filled 2014-11-27: qty 2

## 2014-11-27 NOTE — Discharge Instructions (Signed)

## 2014-11-27 NOTE — ED Provider Notes (Signed)
CSN: [id number]     Arrival date & time 11/27/14  1430 History  This chart was scribed for non-physician practitioner, Fayrene HelperBowie Drusilla Wampole, PA-C working with Juliet RudeNathan R. Rubin PayorPickering, MD, by Abel PrestoKara Demonbreun, ED Scribe. This patient was seen in room TR05C/TR05C and the patient's care was started at 3:40 PM.     Chief Complaint  Patient presents with  . Back Pain    HPI  HPI Comments: Lee Gray is a 53 y.o. male with PMHx of chronic back pain who presents to the Emergency Department complaining of left lower back pain with onset today.  Pt notes associated spasms that radiate down his leg but denies radiation of pain.  Pt notes he worked a lot in his yard yesterday. Pt states he takes his Percocet today but he is out of medication as of right now. Pt denies fever, urinary and bowel problems.  Able to ambulate.     Past Medical History  Diagnosis Date  . Diabetes mellitus without complication   . Hypertension   . Back pain   . Obesity, morbid    History reviewed. No pertinent past surgical history. History reviewed. No pertinent family history. History  Substance Use Topics  . Smoking status: Current Every Day Smoker    Types: Cigarettes  . Smokeless tobacco: Not on file  . Alcohol Use: Not on file    Review of Systems  Constitutional: Negative for fever.  Gastrointestinal: Negative for diarrhea and constipation.  Genitourinary: Negative for difficulty urinating.  Musculoskeletal: Positive for back pain.      Allergies  Review of patient's allergies indicates no known allergies.  Home Medications   Prior to Admission medications   Medication Sig Start Date End Date Taking? Authorizing Provider  acetaminophen (TYLENOL) 325 MG tablet Take 650 mg by mouth every 6 (six) hours as needed for pain.    Historical Provider, MD  albuterol (PROVENTIL HFA;VENTOLIN HFA) 108 (90 BASE) MCG/ACT inhaler Inhale 2 puffs into the lungs every 6 (six) hours as needed for wheezing or shortness of breath.     Historical Provider, MD  albuterol (PROVENTIL) (2.5 MG/3ML) 0.083% nebulizer solution Take 2.5 mg by nebulization every 6 (six) hours as needed for wheezing or shortness of breath.    Historical Provider, MD  aspirin 81 MG tablet Take 81 mg by mouth daily.    Historical Provider, MD  esomeprazole (NEXIUM) 40 MG capsule Take 40 mg by mouth daily.    Historical Provider, MD  furosemide (LASIX) 20 MG tablet Take 20 mg by mouth 2 (two) times daily.     Historical Provider, MD  glimepiride (AMARYL) 4 MG tablet Take 4 mg by mouth daily before breakfast.    Historical Provider, MD  HYDROcodone-acetaminophen (NORCO/VICODIN) 5-325 MG per tablet Take 1 tablet by mouth every 4 (four) hours as needed for pain. 09/28/13   Hannah Muthersbaugh, PA-C  ipratropium-albuterol (DUONEB) 0.5-2.5 (3) MG/3ML SOLN Take 3 mLs by nebulization every 6 (six) hours as needed (for shortness of breath).    Historical Provider, MD  levothyroxine (SYNTHROID, LEVOTHROID) 200 MCG tablet Take 200 mcg by mouth daily before breakfast. *takes with 75mcg for 275mcg dose    Historical Provider, MD  levothyroxine (SYNTHROID, LEVOTHROID) 75 MCG tablet Take 75 mcg by mouth daily before breakfast. Takes with 200mcg for 275mcg dose    Historical Provider, MD  metFORMIN (GLUCOPHAGE) 500 MG tablet Take 500 mg by mouth 2 (two) times daily with a meal.     Historical Provider, MD  methocarbamol (ROBAXIN) 500 MG tablet Take 1 tablet (500 mg total) by mouth 4 (four) times daily. 09/28/13   Hannah Muthersbaugh, PA-C  metoprolol (LOPRESSOR) 50 MG tablet Take 50 mg by mouth 2 (two) times daily.     Historical Provider, MD   BP 118/72 mmHg  Pulse 55  Temp(Src) 98.3 F (36.8 C) (Oral)  Resp 16  SpO2 99% Physical Exam  Constitutional: He is oriented to person, place, and time. He appears well-developed and well-nourished.  HENT:  Head: Normocephalic.  Eyes: Conjunctivae are normal.  Neck: Normal range of motion. Neck supple.  Pulmonary/Chest:  Effort normal.  Musculoskeletal: Normal range of motion.  Left paralumbar spinal tenderness to palpation. Patellar DT reflexes are intact.  Neurological: He is alert and oriented to person, place, and time.  Skin: Skin is warm and dry.  Psychiatric: He has a normal mood and affect. His behavior is normal.  Nursing note and vitals reviewed.   ED Course  Procedures (including critical care time) DIAGNOSTIC STUDIES: Oxygen Saturation is 99% on room air, normal by my interpretation.    COORDINATION OF CARE: 3:43 PM Discussed treatment plan with patient at beside, the patient agrees with the plan and has no further questions at this time.  Acute on chronic back pain.  No red flags.  Will provide a short course of pain medication.  Return precaution discusseed.   Labs Review Labs Reviewed - No data to display  Imaging Review No results found.   EKG Interpretation None      MDM   Final diagnoses:  Chronic back pain   BP 118/72 mmHg  Pulse 55  Temp(Src) 98.3 F (36.8 C) (Oral)  Resp 16  SpO2 99%  I personally performed the services described in this documentation, which was scribed in my presence. The recorded information has been reviewed and is accurate.      Fayrene HelperBowie Damont Balles, PA-C 11/27/14 1603  Candyce ChurnJohn David Wofford III, MD 11/29/14 [phone]

## 2014-11-27 NOTE — ED Notes (Signed)
He had chronic back pain. He worked outside yesterday and felt increased back pain when woke today. Has run out of his pain medication

## 2022-06-01 DEATH — deceased
